# Patient Record
Sex: Female | Born: 1945 | Race: White | Hispanic: No | Marital: Single
Health system: Southern US, Community
[De-identification: ages and names within clinical notes are randomized; demographics above are authoritative.]

---

## 2014-05-14 ENCOUNTER — Ambulatory Visit (INDEPENDENT_AMBULATORY_CARE_PROVIDER_SITE_OTHER): Payer: Self-pay | Admitting: Surgery

## 2014-05-14 DIAGNOSIS — D0512 Intraductal carcinoma in situ of left breast: Secondary | ICD-10-CM

## 2014-05-14 NOTE — H&P (Signed)
Tara JacksonMary Ellen Miranda 05/14/2014 2:29 PM Location: [facility] Patient #: [id number] DOB: [birth year] Single / Language: Shon HoughUndefined / Race: Undefined Female History of Present Illness Maisie Fus(Nanda Bittick A. Taneeka Curtner MD; 05/14/2014 3:08 PM) Patient words: eval left breast   Patient sent at the request of Dr. 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 This was picked up on mammography. Patient has history of bilateral breast calcifications stable since 2014. no beast pain, mass or discharge. Final pathology showed high-grade DCIS.  The patient is a 68 year old female   Other Problems Gilmer Mor(Sonya Bynum, CMA; 05/14/2014 2:32 PM) Anxiety Disorder Bladder Problems Breast Cancer Chronic Obstructive Lung Disease Emphysema Of Lung High blood pressure Home Oxygen Use  Past Surgical History Gilmer Mor(Sonya Bynum, CMA; 05/14/2014 2:32 PM) Appendectomy Breast Biopsy Left. Colon Polyp Removal - Colonoscopy Gallbladder Surgery - Laparoscopic Hysterectomy (due to cancer) - Partial Tonsillectomy  Diagnostic Studies History Gilmer Mor(Sonya Bynum, CMA; 05/14/2014 2:32 PM) Colonoscopy 1-5 years ago Mammogram within last year Pap Smear >5 years ago  Allergies Gilmer Mor(Sonya Bynum, CMA; 05/14/2014 2:32 PM) No Known Drug Allergies 05/14/2014  Medication History (Sonya Bynum, CMA; 05/14/2014 2:31 PM) LORazepam (1MG  Tablet, Oral daily) Active. Klor-Con 10 (10MEQ Tablet ER, Oral daily) Active. Losartan Potassium (50MG  Tablet, Oral daily) Active. Spiriva HandiHaler (18MCG Capsule, Inhalation daily) Active. Symbicort (160-4.5MCG/ACT Aerosol, Inhalation daily) Active. ProAir HFA (108 (90 Base)MCG/ACT Aerosol Soln, Inhalation as needed) Active. FLUoxetine HCl (40MG  Capsule, Oral daily) Active. Albuterol Sulfate (0.63MG /3ML Nebulized Soln, Inhalation daily) Active. Docusate Sodium (100MG  Capsule, Oral as needed) Active.  Social History Gilmer Mor(Sonya Bynum, CMA; 05/14/2014  2:32 PM) Caffeine use Carbonated beverages, Tea. No alcohol use No drug use Tobacco use Former smoker.  Family History Gilmer Mor(Sonya Bynum, CMA; 05/14/2014 2:32 PM) Arthritis Mother. Cancer Father. Colon Cancer Father. Hypertension Brother. Melanoma Brother. Ovarian Cancer Mother. Respiratory Condition Father.  Pregnancy / Birth History Gilmer Mor(Sonya Bynum, CMA; 05/14/2014 2:32 PM) Age at menarche 11 years. Age of menopause 896-59 Gravida 0 Para 0     Review of Systems Lamar Laundry(Sonya Bynum CMA; 05/14/2014 2:32 PM) General Present- Night Sweats. Not Present- Appetite Loss, Chills, Fatigue, Fever, Weight Gain and Weight Loss. Skin Present- Dryness. Not Present- Change in Wart/Mole, Hives, Jaundice, New Lesions, Non-Healing Wounds, Rash and Ulcer. HEENT Present- Seasonal Allergies and Wears glasses/contact lenses. Not Present- Earache, Hearing Loss, Hoarseness, Nose Bleed, Oral Ulcers, Ringing in the Ears, Sinus Pain, Sore Throat, Visual Disturbances and Yellow Eyes. Respiratory Present- Difficulty Breathing. Not Present- Bloody sputum, Chronic Cough, Snoring and Wheezing. Breast Not Present- Breast Mass, Breast Pain, Nipple Discharge and Skin Changes. Cardiovascular Present- Shortness of Breath. Not Present- Chest Pain, Difficulty Breathing Lying Down, Leg Cramps, Palpitations, Rapid Heart Rate and Swelling of Extremities. Gastrointestinal Present- Constipation and Hemorrhoids. Not Present- Abdominal Pain, Bloating, Bloody Stool, Change in Bowel Habits, Chronic diarrhea, Difficulty Swallowing, Excessive gas, Gets full quickly at meals, Indigestion, Nausea, Rectal Pain and Vomiting. Female Genitourinary Present- Urgency. Not Present- Frequency, Nocturia, Painful Urination and Pelvic Pain. Musculoskeletal Present- Back Pain, Muscle Pain and Muscle Weakness. Not Present- Joint Pain, Joint Stiffness and Swelling of Extremities. Neurological Present- Trouble walking. Not Present- Decreased Memory,  Fainting, Headaches, Numbness, Seizures, Tingling, Tremor and Weakness. Psychiatric Present- Anxiety. Not Present- Bipolar, Change in Sleep Pattern, Depression, Fearful and Frequent crying. Endocrine Present- Cold Intolerance, Heat Intolerance and Hot flashes. Not Present- Excessive Hunger, Hair Changes and New Diabetes. Hematology Not Present- Easy Bruising, Excessive bleeding, Gland problems, HIV and Persistent Infections.  Vitals [employer] researcher(Sonya  Bynum CMA; 05/14/2014 2:33 PM) 05/14/2014 2:32 PM Weight: 110 lb Height: 60in Body Surface Area: 1.45 m Body Mass Index: 21.48 kg/m Temp.: 99.75F(Temporal)  Pulse: 72 (Regular)  BP: 124/80 (Sitting, Left Arm, Standard)     Physical Exam (Dakoda Laventure A. Harjit Douds MD; 05/14/2014 3:13 PM)  General Mental Status-Alert. General Appearance-Consistent with stated age. Hydration-Well hydrated. Voice-Normal.  Chest and Lung Exam Note: On home oxygen. Comfortable appearing. Lungs clear. No wheezing.   Breast Breast - Left-Biopsy scar, Non Tender, No Dimpling. Note:small bx incision from core Breast - Right-Non Tender, No Dimpling.  Cardiovascular Cardiovascular examination reveals -on palpation PMI is normal in location and amplitude, no palpable S3 or S4. Normal cardiac borders., normal heart sounds, regular rate and rhythm with no murmurs, carotid auscultation reveals no bruits and normal pedal pulses bilaterally.  Neurologic Neurologic evaluation reveals -alert and oriented x 3 with no impairment of recent or remote memory. Mental Status-Normal.  Musculoskeletal Normal Exam - Left-Upper Extremity Strength Normal and Lower Extremity Strength Normal. Normal Exam - Right-Upper Extremity Strength Normal, Lower Extremity Weakness.  Lymphatic Axillary  General Axillary Region: Left - Description - Normal. Right - Description - Normal.    Assessment & Plan (Richy Spradley A. 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 Patient will obtain mammograms in sent to me for review. Area small amount, left breast lumpectomy possible. Has bad lung disease and may need pulmonary clearance. Risk of lumpectomy include bleeding, infection, seroma, more surgery, use of seed/wire, wound care, cosmetic deformity and the need for other treatments, death , blood clots, death. Pt agrees to proceed.  Current Plans Pt Education - Lumpectomy and Axillary Lymph Node Dissection: surgery COPD (CHRONIC OBSTRUCTIVE PULMONARY DISEASE) (496  J44.9)

## 2014-05-17 ENCOUNTER — Other Ambulatory Visit (INDEPENDENT_AMBULATORY_CARE_PROVIDER_SITE_OTHER): Payer: Self-pay

## 2014-05-17 DIAGNOSIS — D0512 Intraductal carcinoma in situ of left breast: Secondary | ICD-10-CM

## 2014-05-24 ENCOUNTER — Ambulatory Visit
Admission: RE | Admit: 2014-05-24 | Discharge: 2014-05-24 | Disposition: A | Discharge status: Home / Self Care | Payer: Medicare Other | Source: Ambulatory Visit | Attending: Surgery | Admitting: Surgery

## 2014-05-24 DIAGNOSIS — D0512 Intraductal carcinoma in situ of left breast: Secondary | ICD-10-CM

## 2014-05-24 MED ORDER — GADOBENATE DIMEGLUMINE 529 MG/ML IV SOLN
9.0000 mL | Freq: Once | INTRAVENOUS | Status: AC | PRN
  Administered 2014-05-24: 9 mL via INTRAVENOUS

## 2014-05-27 ENCOUNTER — Telehealth (INDEPENDENT_AMBULATORY_CARE_PROVIDER_SITE_OTHER): Payer: Self-pay

## 2014-05-27 NOTE — Telephone Encounter (Signed)
Pt's daughter is calling to request results from MRI on 05/24/14.  Dr. Luisa Hartornett was to decide whether he will perform surgery or not.  Pt is now in hospital in [place].  Please call her at [phone].

## 2014-05-28 NOTE — Telephone Encounter (Signed)
Will need lumpectomy for removal of this area when able. Needs to see lung MD prior to surgery  Once cleared can set up lumpectomy and will need to add lymph node biopsy at same time. Pt or daughter can call office once she is out of hospital to set this up.

## 2014-05-29 NOTE — Telephone Encounter (Signed)
Contacted the patient today.  She is still in the hospital for pulmonary issues.  I relayed Dr. Rosezena Sensorornett's message.  She hopes to be discharged tomorrow, and will contact us when she is ready and able to have the surgery and biopsy.  Dr. Luisa Hartornett made aware.

## 2014-06-04 ENCOUNTER — Ambulatory Visit (INDEPENDENT_AMBULATORY_CARE_PROVIDER_SITE_OTHER): Payer: Self-pay | Admitting: Surgery

## 2014-06-04 DIAGNOSIS — D0512 Intraductal carcinoma in situ of left breast: Secondary | ICD-10-CM

## 2016-02-24 DEATH — deceased

## 2016-06-09 IMAGING — MR MR BREAST BILATERAL W WO CONTRAST
9 of 14 series · 31 of 48 positions shown · IV contrast (multihance)
Comparison: Previous breast imaging from [REDACTED] 05/01/2014 and earlier

CLINICAL DATA: Left breast DCIS with possible invasion following
stereotactic guided core biopsy of left breast calcifications
performed at [REDACTED]

LABS:  BUN and creatinine were obtained on site at [HOSPITAL]
[HOSPITAL].
Results:  BUN 11 mg/dL,  Creatinine 0.7 mg/dL.
EXAM:
BILATERAL BREAST MRI WITH AND WITHOUT CONTRAST
TECHNIQUE: Multiplanar, multisequence MR images of both breasts were obtained
prior to and following the intravenous administration of 9ml of
MultiHance.

[Series 2: T2 · axial · 3.0mm · 0.94mm/px · 1 of 53 slices shown (1 of 2)]
[im 1/53]
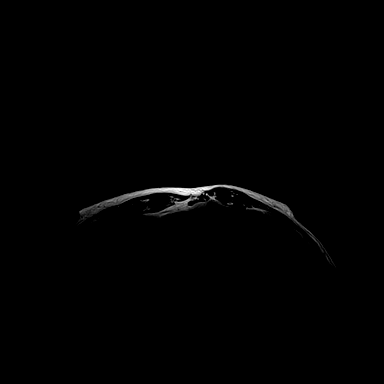

[Series 3: t2_tirm_tra ipat (a-p) · axial · 3.0mm · 0.70mm/px · z∈[-56,+106]mm · 2 of 55 slices shown]
[im 1/55]
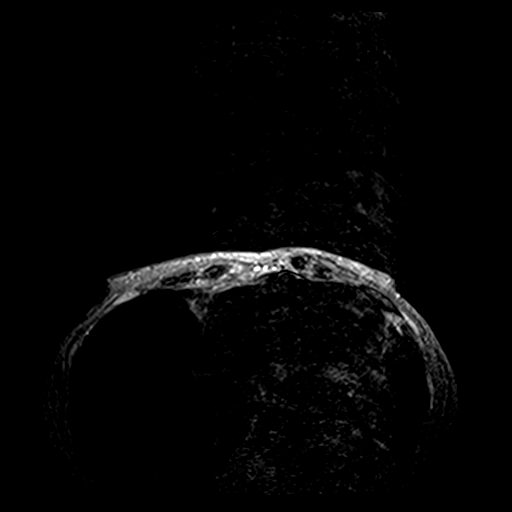
[im 55/55]
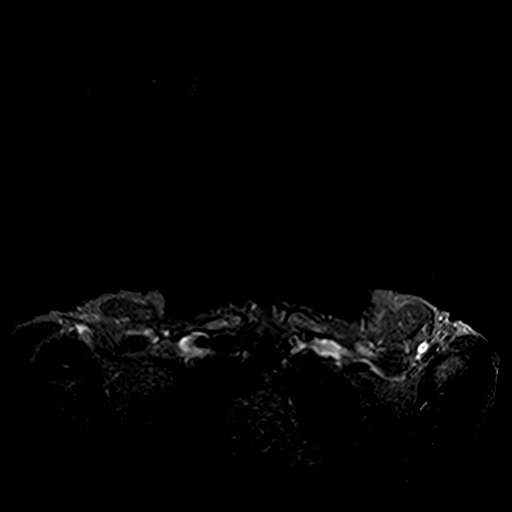

[Series 4: fl3d pre-cm no · axial · non-contrast · 1.2mm · 0.94mm/px · z∈[-61,+111]mm · 5 of 144 slices shown]
[im 1/144]
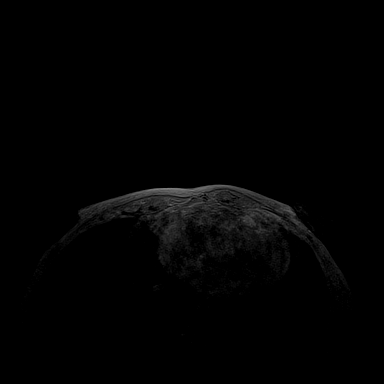
[im 36/144]
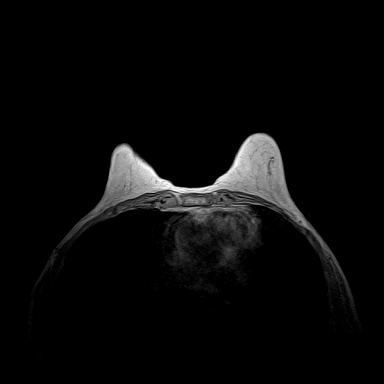
[im 72/144]
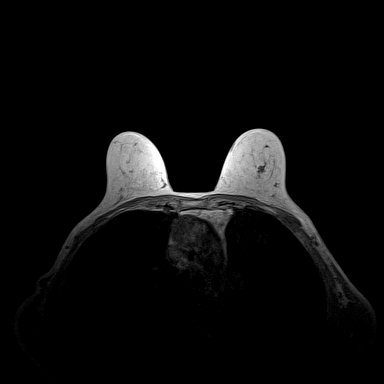
[im 108/144]
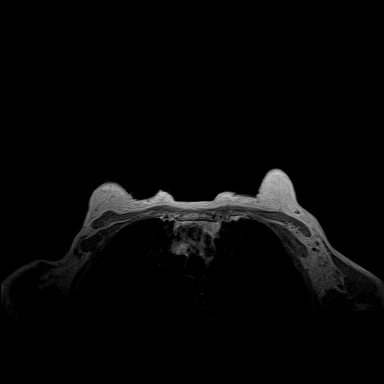
[im 144/144]
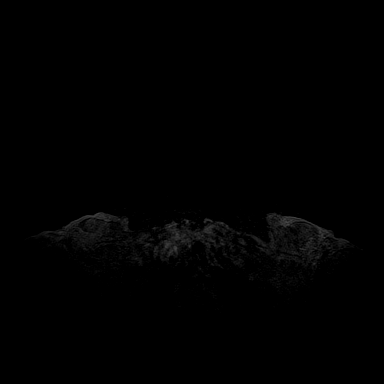

[Series 5: T2 · axial · 3.0mm · 0.94mm/px · z∈[-56,+106]mm · 2 of 51 slices shown (2 of 2)]
[im 1/51]
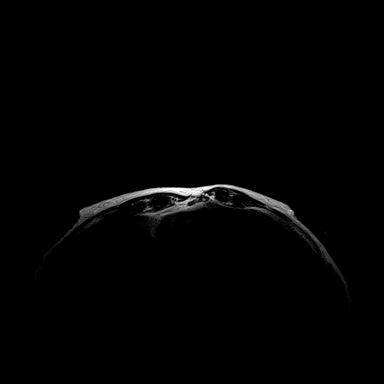
[im 51/51]
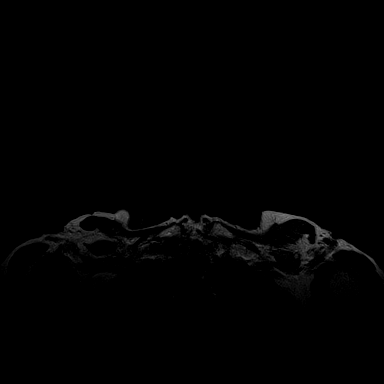

[Series 6: fl3d pre-cm · axial · non-contrast · 1.2mm · 0.94mm/px · z∈[-61,+111]mm · 5 of 144 slices shown]
[im 1/144]
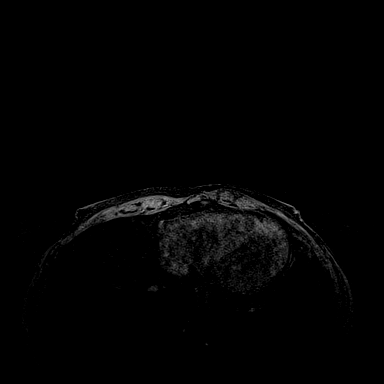
[im 36/144]
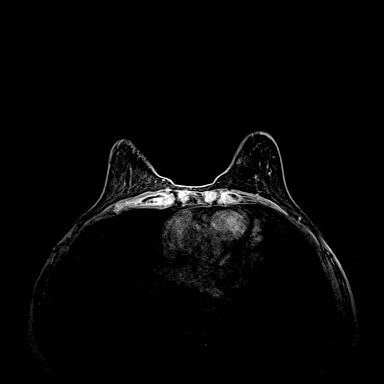
[im 72/144]
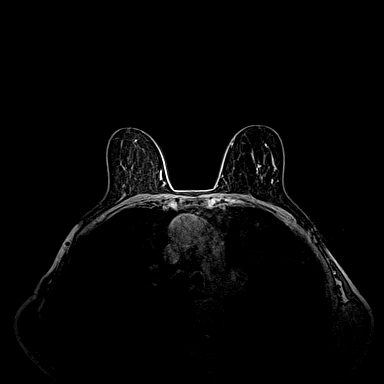
[im 108/144]
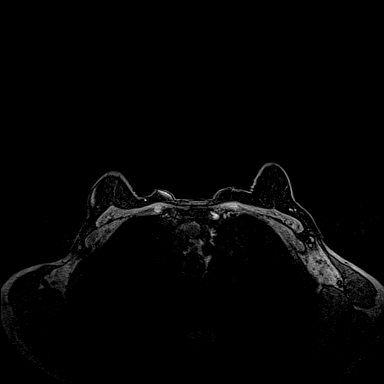
[im 144/144]
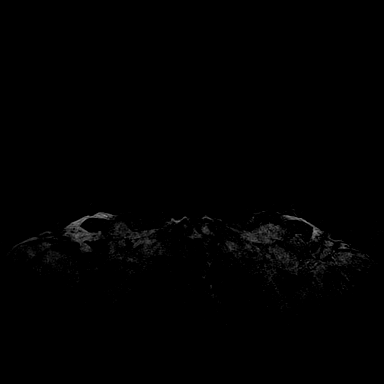

[Series 7: fl3d post-cm 20 · axial · 1.2mm · 0.94mm/px · z∈[-61,+111]mm · 5 of 144 slices shown (1 of 3)]
[im 1/144]
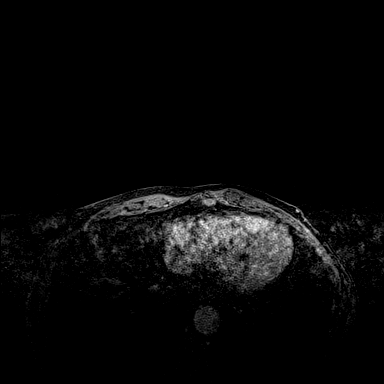
[im 36/144]
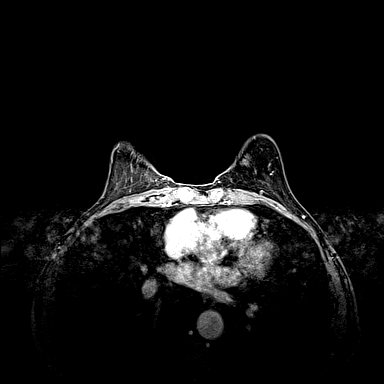
[im 72/144]
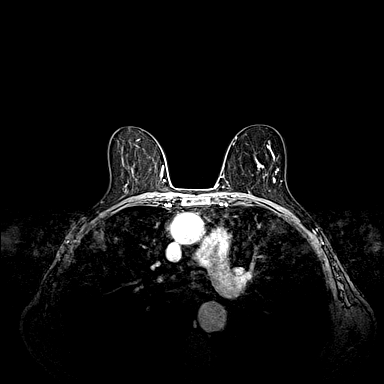
[im 108/144]
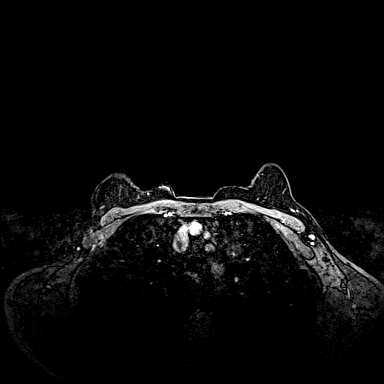
[im 144/144]
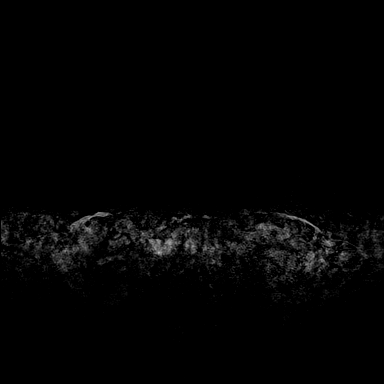

[Series 8: fl3d post-cm 20 · axial · 1.2mm · 0.94mm/px · z∈[-61,+111]mm · 5 of 144 slices shown (2 of 3)]
[im 1/144]
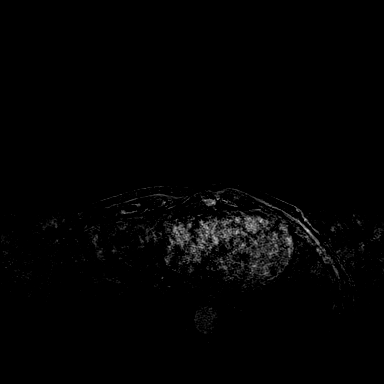
[im 36/144]
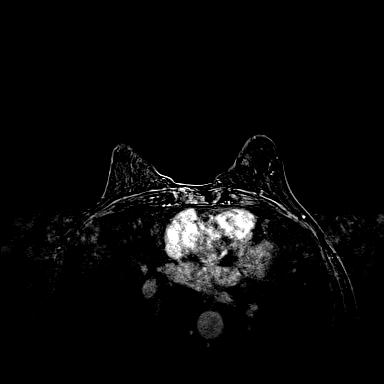
[im 72/144]
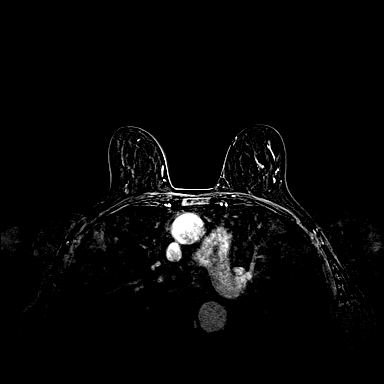
[im 108/144]
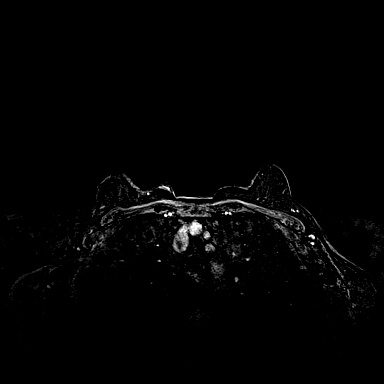
[im 144/144]
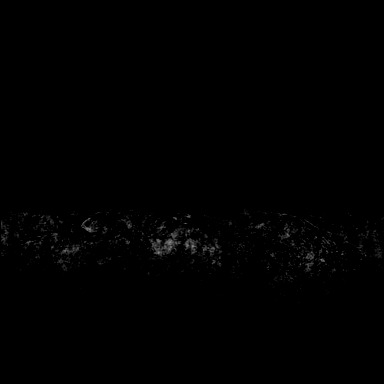

[Series 9: fl3d post-cm 20 · axial · 172.8mm · 0.94mm/px · 1 of 1 slices shown (3 of 3)]
[im 1/1]
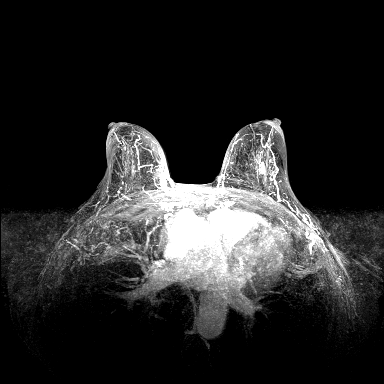

[Series 10: fl3d post-cm 3min · axial · 1.2mm · 0.94mm/px · z∈[-61,+111]mm · 5 of 144 slices shown]
[im 1/144]
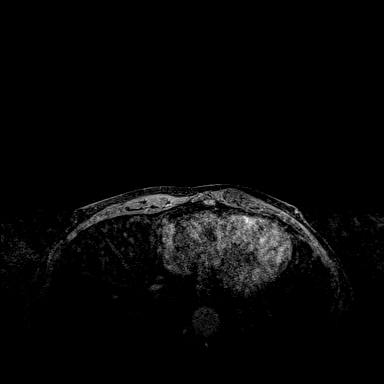
[im 36/144]
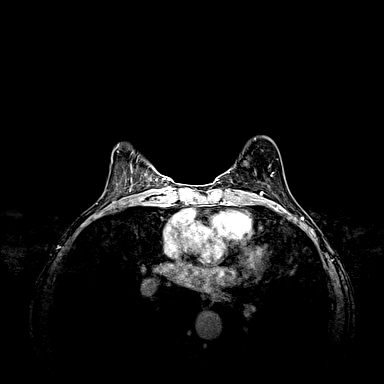
[im 72/144]
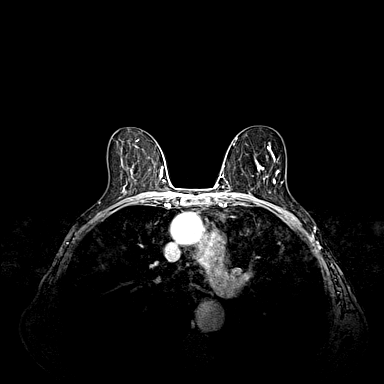
[im 108/144]
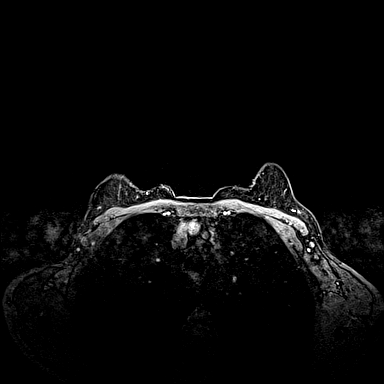
[im 144/144]
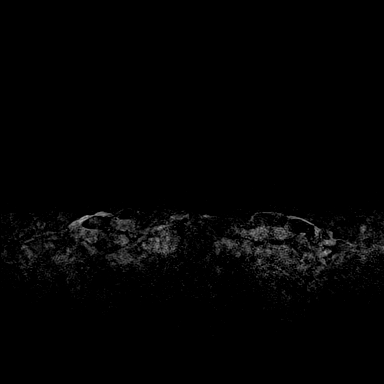

[31 of 48 positions shown; findings below may reference images not displayed]

THREE-DIMENSIONAL MR IMAGE RENDERING ON INDEPENDENT WORKSTATION:

Three-dimensional MR images were rendered by post-processing of the
original MR data on an independent workstation. The
three-dimensional MR images were interpreted, and findings are
reported in the following complete MRI report for this study. Three
dimensional images were evaluated at the independent [employer]
workstation
FINDINGS: Breast composition: b.  Scattered fibroglandular tissue.

Background parenchymal enhancement: Mild

Right breast: No mass or abnormal enhancement.

Left breast: Within the upper central portion of the left breast
there is Tissue marker artifact following a recent stereotactic
guided core biopsy. Surrounding the biopsy tract there is minimal
rim enhancement. Just below the artifact from the tissue marker clip
there is a small nodule of enhancement which measures 1.3 x 1.0 x
0.9 cm consistent with residual malignancy. This nodule of
enhancement demonstrates rapid wash-in and persistent type
enhancement kinetics.

Lymph nodes: No suspicious lymph nodes identified.

Ancillary findings:  None.
IMPRESSION: 1. Post biopsy changes in the upper central portion of the left
breast consistent with recent stereotactic guided core biopsy.
Immediately below the tissue marker clip there is a small area of
persistent type enhancement consistent with residual malignancy
measuring maximum 1.3 cm.
2. No suspicious findings in the right breast.

RECOMMENDATION:
Treatment plan regarding known malignancy.

BI-RADS CATEGORY  6: Known biopsy-proven malignancy.

## 2020-12-26 ENCOUNTER — Other Ambulatory Visit (HOSPITAL_COMMUNITY): Payer: Self-pay
